# Patient Record
Sex: Male | Born: 1975 | Race: White | Hispanic: No | Marital: Married
Health system: Southern US, Community
[De-identification: ages and names within clinical notes are randomized; demographics above are authoritative.]

---

## 2007-09-12 ENCOUNTER — Emergency Department (HOSPITAL_COMMUNITY): Admission: EM | Admit: 2007-09-12 | Discharge: 2007-09-12 | Payer: Self-pay | Admitting: Emergency Medicine

## 2008-03-02 ENCOUNTER — Inpatient Hospital Stay (HOSPITAL_COMMUNITY): Admission: EM | Admit: 2008-03-02 | Discharge: 2008-03-06 | Payer: Self-pay | Admitting: Emergency Medicine

## 2008-04-26 ENCOUNTER — Ambulatory Visit (HOSPITAL_COMMUNITY): Admission: RE | Admit: 2008-04-26 | Discharge: 2008-04-26 | Payer: Self-pay | Admitting: General Surgery

## 2010-01-10 IMAGING — CT CT ABDOMEN W/ CM
2 of 5 series · 13 of 42 positions shown, 19 images · IV contrast (agent unspecified)
Comparison: None

CT ABDOMEN

CLINICAL DATA: Snowboarding accident.  Left flank pain, left upper
quadrant pain.

CT ABDOMEN AND PELVIS WITH CONTRAST
TECHNIQUE: Multidetector CT imaging of the abdomen and pelvis was
performed using the standard protocol following bolus
administration of intravenous contrast.
Contrast: 100 ml Gmnipaque-OMM

[Series 2: routine abdomen · axial · 0.87mm/px · z∈[-454,-49]mm · 10 of 101 slices shown, 16 images]
[im 10/101  soft-tissue]
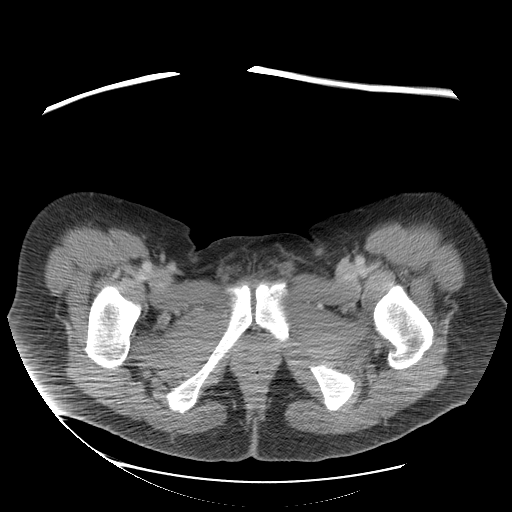
[im 10/101  bone]
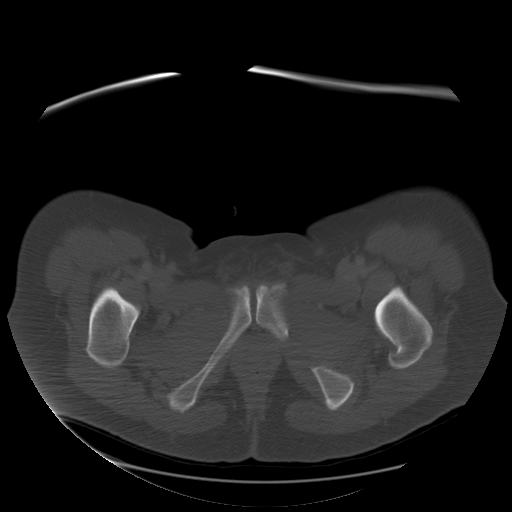
[im 19/101  soft-tissue]
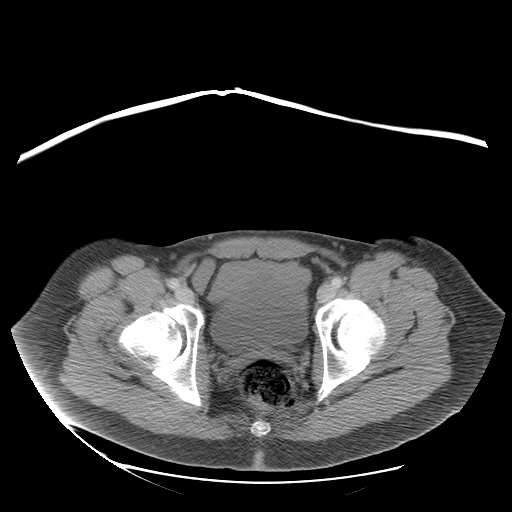
[im 28/101  soft-tissue]
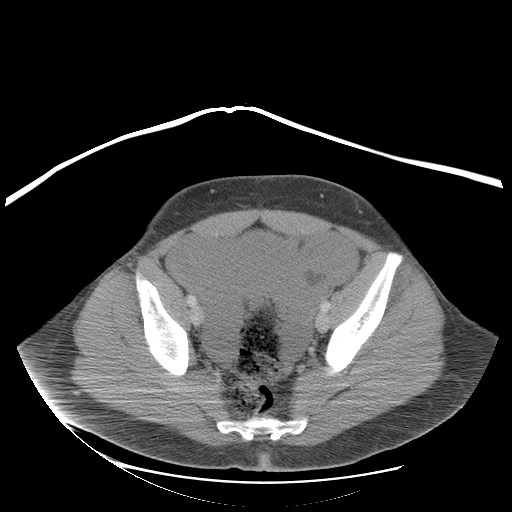
[im 37/101  soft-tissue]
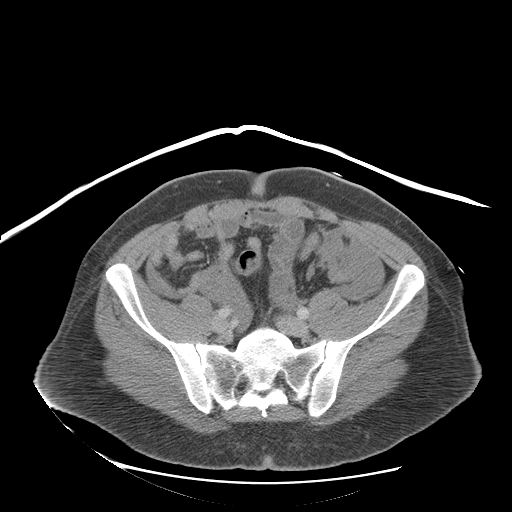
[im 46/101  soft-tissue]
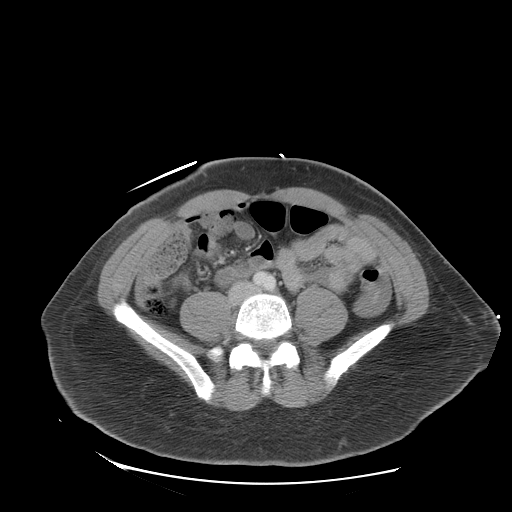
[im 55/101  soft-tissue]
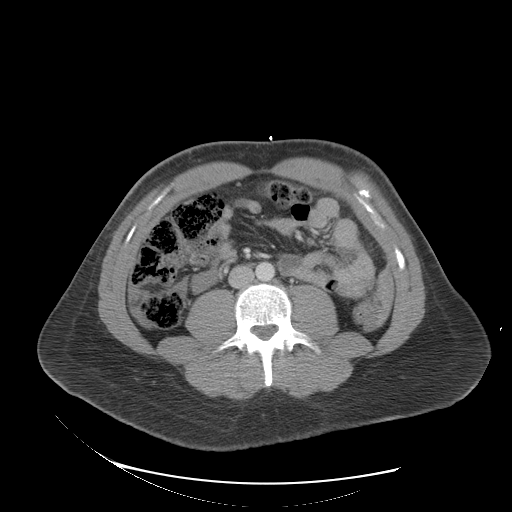
[im 64/101  soft-tissue]
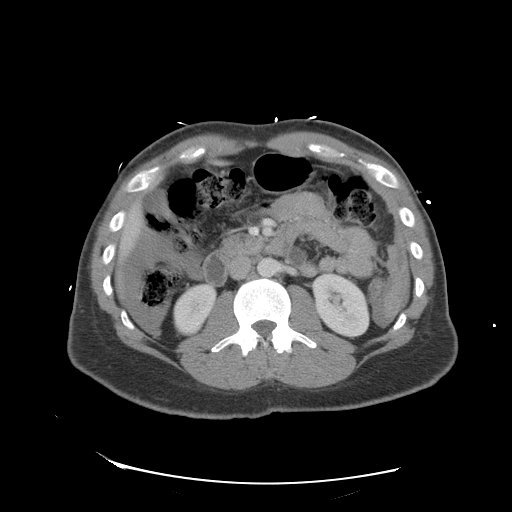
[im 64/101  lung]
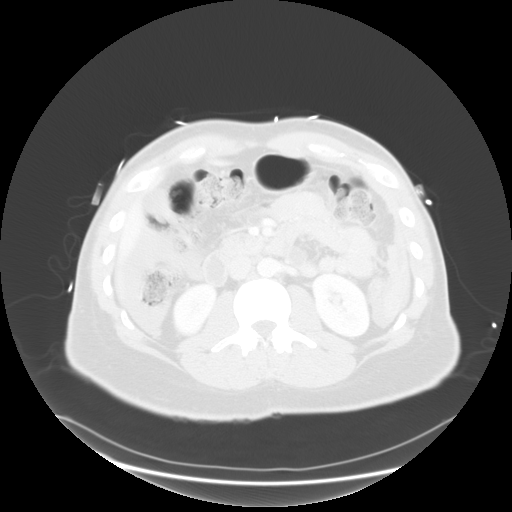
[im 73/101  soft-tissue]
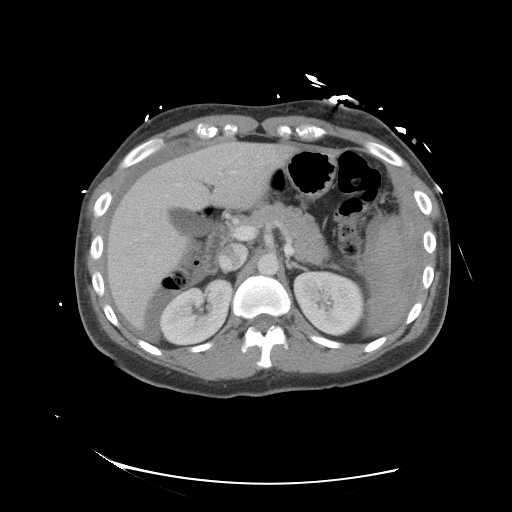
[im 73/101  lung]
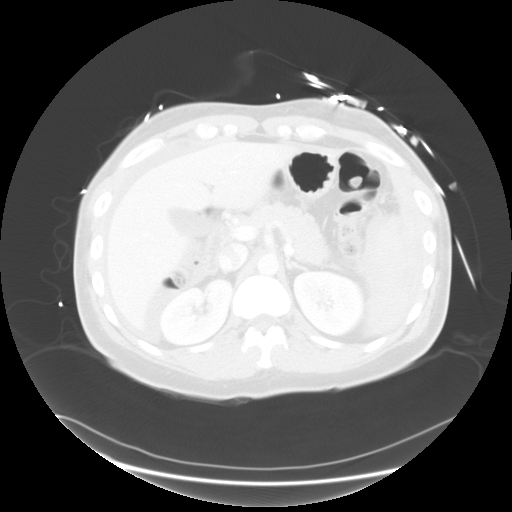
[im 82/101  soft-tissue]
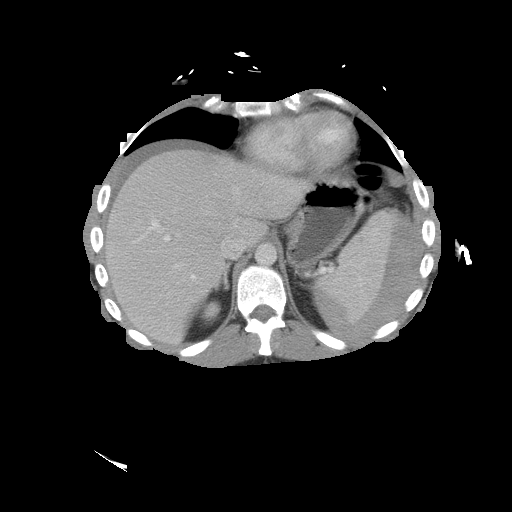
[im 82/101  lung]
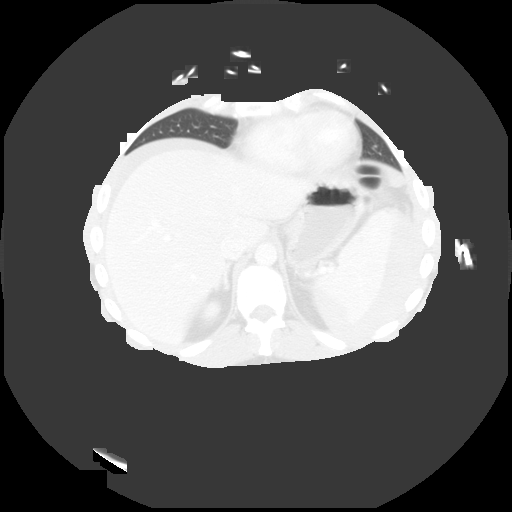
[im 82/101  bone]
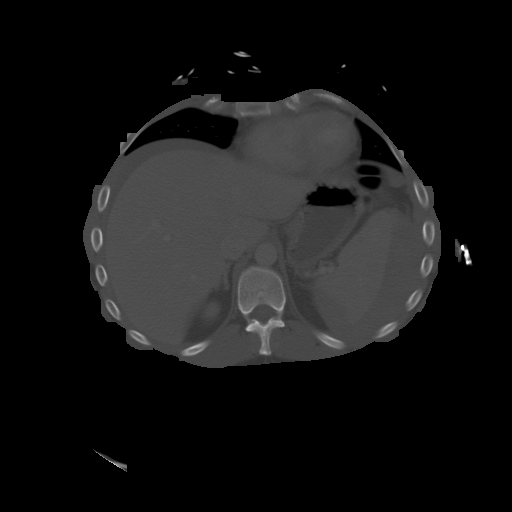
[im 91/101  soft-tissue]
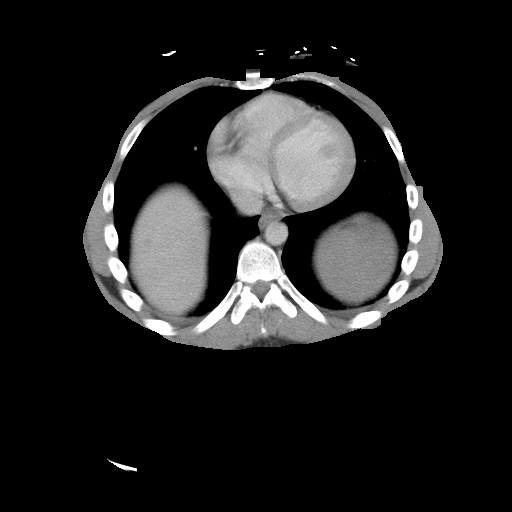
[im 91/101  lung]
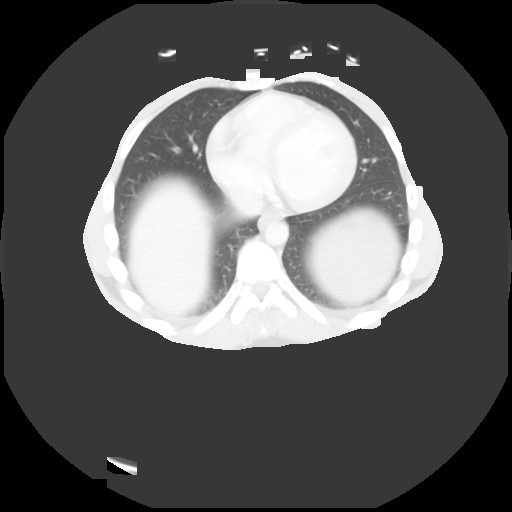

[Series 400: cor · coronal · 1.00mm/px · 3 of 85 slices shown]
[im 29/85  soft-tissue]
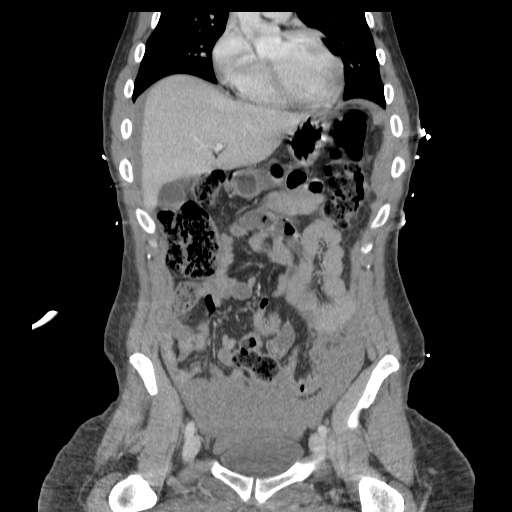
[im 38/85  soft-tissue]
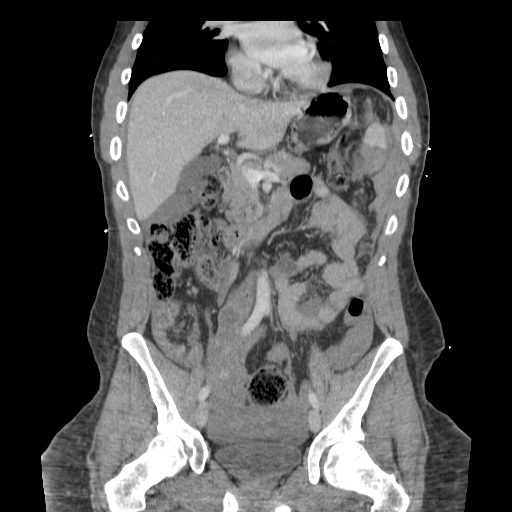
[im 47/85  soft-tissue]
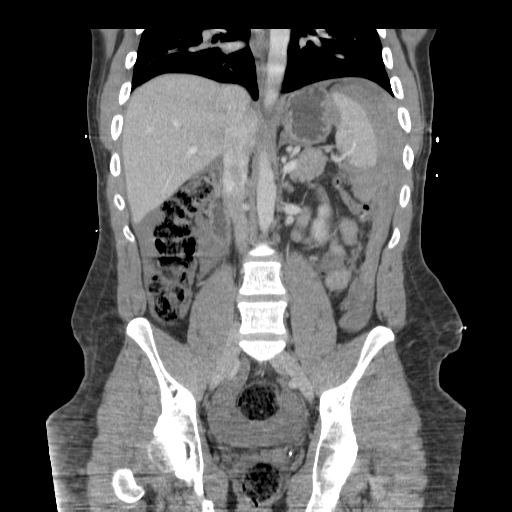

[13 of 42 positions shown; findings below may reference images not displayed]

FINDINGS: Lacerations are noted through the inferior aspect of the
spleen.  There is a large amount of perisplenic blood.  No active
extravasation seen.  Blood also noted around the liver.  Liver,
pancreas, adrenals, kidneys unremarkable.  Bowel grossly
unremarkable.  No free air.  Aorta is normal caliber.

Lung bases are clear.  No effusions.  Heart is normal size. No
acute bony abnormality.
IMPRESSION: At least two lacerations noted in the inferior aspect of the
spleen, with moderate hemoperitoneum

CT PELVIS
FINDINGS: Moderate to large layering blood in the pelvis.  Bowel
grossly unremarkable.  No free air.  Bony pelvis intact.
IMPRESSION: Moderate to large hemoperitoneum.

Critical test results telephoned to Dr. Kamal Eddine at the time of
interpretation on 03/02/2008 at 8111.

## 2010-06-15 LAB — SAMPLE TO BLOOD BANK

## 2010-06-15 LAB — DIFFERENTIAL
Basophils Absolute: 0 10*3/uL (ref 0.0–0.1)
Basophils Absolute: 0 K/uL (ref 0.0–0.1)
Basophils Relative: 0 % (ref 0–1)
Basophils Relative: 0 % (ref 0–1)
Eosinophils Absolute: 0 10*3/uL (ref 0.0–0.7)
Eosinophils Relative: 0 % (ref 0–5)
Lymphocytes Relative: 29 % (ref 12–46)
Lymphocytes Relative: 9 % — ABNORMAL LOW (ref 12–46)
Lymphs Abs: 0.9 K/uL (ref 0.7–4.0)
Monocytes Absolute: 0.8 10*3/uL (ref 0.1–1.0)
Monocytes Absolute: 1.1 10*3/uL — ABNORMAL HIGH (ref 0.1–1.0)
Monocytes Relative: 10 % (ref 3–12)
Neutro Abs: 2.7 10*3/uL (ref 1.7–7.7)
Neutro Abs: 8.8 K/uL — ABNORMAL HIGH (ref 1.7–7.7)
Neutrophils Relative %: 54 % (ref 43–77)
Neutrophils Relative %: 81 % — ABNORMAL HIGH (ref 43–77)

## 2010-06-15 LAB — CBC
HCT: 27.7 % — ABNORMAL LOW (ref 39.0–52.0)
HCT: 31.8 % — ABNORMAL LOW (ref 39.0–52.0)
HCT: 38.5 % — ABNORMAL LOW (ref 39.0–52.0)
Hemoglobin: 10.6 g/dL — ABNORMAL LOW (ref 13.0–17.0)
Hemoglobin: 12.8 g/dL — ABNORMAL LOW (ref 13.0–17.0)
Hemoglobin: 9.1 g/dL — ABNORMAL LOW (ref 13.0–17.0)
Hemoglobin: 9.3 g/dL — ABNORMAL LOW (ref 13.0–17.0)
MCHC: 33.2 g/dL (ref 30.0–36.0)
MCHC: 33.7 g/dL (ref 30.0–36.0)
MCHC: 33.8 g/dL (ref 30.0–36.0)
MCHC: 34.5 g/dL (ref 30.0–36.0)
MCV: 93.2 fL (ref 78.0–100.0)
MCV: 93.3 fL (ref 78.0–100.0)
MCV: 93.7 fL (ref 78.0–100.0)
MCV: 93.9 fL (ref 78.0–100.0)
Platelets: 160 10*3/uL (ref 150–400)
Platelets: 173 10*3/uL (ref 150–400)
Platelets: 257 K/uL (ref 150–400)
RBC: 2.81 MIL/uL — ABNORMAL LOW (ref 4.22–5.81)
RBC: 2.95 MIL/uL — ABNORMAL LOW (ref 4.22–5.81)
RBC: 2.95 MIL/uL — ABNORMAL LOW (ref 4.22–5.81)
RBC: 3.35 MIL/uL — ABNORMAL LOW (ref 4.22–5.81)
RBC: 3.41 MIL/uL — ABNORMAL LOW (ref 4.22–5.81)
RBC: 4.13 MIL/uL — ABNORMAL LOW (ref 4.22–5.81)
RDW: 12.7 % (ref 11.5–15.5)
RDW: 12.8 % (ref 11.5–15.5)
RDW: 13.1 % (ref 11.5–15.5)
WBC: 10.9 K/uL — ABNORMAL HIGH (ref 4.0–10.5)
WBC: 4.6 10*3/uL (ref 4.0–10.5)
WBC: 5 10*3/uL (ref 4.0–10.5)
WBC: 5.3 10*3/uL (ref 4.0–10.5)
WBC: 6.3 10*3/uL (ref 4.0–10.5)

## 2010-06-15 LAB — BASIC METABOLIC PANEL
CO2: 25 mEq/L (ref 19–32)
Chloride: 106 mEq/L (ref 96–112)
GFR calc Af Amer: >60 mL/min (ref >60)
Potassium: 3.8 mEq/L (ref 3.5–5.1)
Sodium: 138 mEq/L (ref 135–145)

## 2010-06-15 LAB — COMPREHENSIVE METABOLIC PANEL
ALT: 19 U/L (ref 0–53)
CO2: 26 mEq/L (ref 19–32)
Calcium: 8 mg/dL — ABNORMAL LOW (ref 8.4–10.5)
Chloride: 107 mEq/L (ref 96–112)
Creatinine, Ser: 0.98 mg/dL (ref 0.4–1.5)
GFR calc non Af Amer: >60 mL/min (ref >60)
Glucose, Bld: 159 mg/dL — ABNORMAL HIGH (ref 70–99)
Sodium: 137 mEq/L (ref 135–145)
Total Bilirubin: 1.4 mg/dL — ABNORMAL HIGH (ref 0.3–1.2)

## 2010-06-15 LAB — BASIC METABOLIC PANEL WITH GFR
BUN: 20 mg/dL (ref 6–23)
Calcium: 9 mg/dL (ref 8.4–10.5)
Creatinine, Ser: 1.04 mg/dL (ref 0.4–1.5)
GFR calc non Af Amer: >60 mL/min (ref >60)
Glucose, Bld: 133 mg/dL — ABNORMAL HIGH (ref 70–99)

## 2010-06-15 LAB — LIPASE, BLOOD: Lipase: 14 U/L (ref 11–59)

## 2010-06-15 LAB — HEMOGLOBIN AND HEMATOCRIT, BLOOD
HCT: 27.6 % — ABNORMAL LOW (ref 39.0–52.0)
Hemoglobin: 9.5 g/dL — ABNORMAL LOW (ref 13.0–17.0)

## 2010-07-14 NOTE — Discharge Summary (Signed)
NAME:  Frederick Welch, Frederick Welch NO.:  [id number]   MEDICAL RECORD NO.:  [id number]          PATIENT TYPE:  INP   LOCATION:  3303                         FACILITY:  [facility]   PHYSICIAN:  Cherylynn Ridges, M.D.    DATE OF BIRTH:  Feb 01, 1976   DATE OF ADMISSION:  03/02/2008  DATE OF DISCHARGE:  03/06/2008                               DISCHARGE SUMMARY   ADMITTING TRAUMA SURGEON:  Avel Peace, MD   DISCHARGE DIAGNOSES:  1. Status post snowboarding accident.  2. Grade 3 splenic laceration.  3. Hemoperitoneum.  4. Moderate-to-acute blood loss anemia.   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  He had a fair amount of pain,  but was eventually able to make it [place] the [place] and then went home  back here in [place].  When he returned home, he had increasing pain,  especially with deep breaths and at one point, he got up and had a near  syncopal episode.  He subsequently was brought to the emergency room for  evaluation and a CT scan of the abdomen revealed lacerations of the  inferior spleen with a moderate-sized hemoperitoneum.  Dr. Abbey Chatters  was consulted and the patient was evaluated.  Again, he had complaints  of abdominal pain.  A CT scan was reviewed and again showed inferior  splenic laceration grade 3 with perisplenic, perihepatic, and pelvic  free fluid.  Hemoglobin was 12.8.  He was hemodynamically stable with a  blood pressure of 106/64, pulse of 76, respiratory rate of 18, O2 sat of  100% on room air when Dr. Abbey Chatters evaluated the patient.  He was  admitted to the ICU for observation and did well and remained  hemodynamically stable overnight.  His hemoglobin remained stable in the  10.6 range.  He was kept on strict bedrest through the hospital day 3  and allowed to be out of bed to chair on hospital day #4,  although his  hemoglobin had continued to drift downward to 8.9.  His IV fluids were  discontinued as he was taking p.o.'s well and his hemoglobin did improve  the following morning to 9.5, platelets improved to 176.  He was  ambulated and again was continuing to tolerate a regular diet well and  hemoglobin later that day was 10.8.  As he was ambulatory and continued  to feel well with little complaints of pain, it was felt he could be  discharged home with his wife who is also a PA here locally.  He was  cautioned against any vigorous activity for the next several weeks.  He  will follow up in [facility]uma Clinic on March 14, 2008 at 2:00 p.m. or  sooner should he have any difficulties in the interim.  He is cautioned  against the use of aspirin or ibuprofen products after discharge.   Diet is regular.   Medications include Percocet 5/325 one to two p.o. q.4 h. p.r.n. pain  #80 no refill, Tylenol as needed  for milder pain, multivitamin with iron  one daily x1 month and Colace as needed.   Again, he will follow up with Trauma Service on March 14, 2008.  We do  anticipate that he will need to be set up for a followup CT scan  approximately 6 weeks post injury to determine return to normal activity  level.  We will also need to determine timeframe to return to work when  he is seen at his office visit next week.      Shawn Rayburn, P.A.      Cherylynn Ridges, M.D.  Electronically Signed    SR/MEDQ  D:  03/06/2008  T:  03/07/2008  Job:  [id number]

## 2010-07-14 NOTE — H&P (Signed)
NAME:  Frederick Welch, Frederick Welch NO.:  [id number]   MEDICAL RECORD NO.:  [id number]          PATIENT TYPE:  INP   LOCATION:  2102                         FACILITY:  [facility]   PHYSICIAN:  Adolph Pollack, M.D.DATE OF BIRTH:  12/23/1975   DATE OF ADMISSION:  03/02/2008  DATE OF DISCHARGE:                              HISTORY & PHYSICAL   HISTORY:  The patient is an otherwise healthy 35 year old male, snow-  boarding today up in [place] and he fell onto his left side at noon.  He had a fair amount of pain, was able to make it down the mountain and  then went home.  While he was at home, he had some increasing pain,  especially with the deep breath and at one point, he got up and had near  syncopal episode when lying down in bed.  He subsequently drove himself  here to the hospital for evaluation.  Part of his evaluation include a  CT scan which demonstrated lacerations of the inferior spleen with a  moderate-sized hemoperitoneum.  At this point, I was asked to see him.  He has had IV fluids and has not been tachycardic, but had pressures in  the low 90s at times.   He says it is difficult for him to lie down because of pain in the tops  of both shoulders.  He is a little bit more comfortable now after pain  medication.  One dip in his blood pressure correlated with  administration of pain medicine.  No nausea.  Denies any other trauma.   PAST MEDICAL HISTORY:  No chronic illnesses.   PREVIOUS OPERATIONS:  Removal of cyst for knee.   ALLERGIES:  None.   MEDICATIONS:  He took Motrin and Tylenol 3 after the injury today   SOCIAL HISTORY:  He lives with his wife.  He is employed as a  Surveyor, mining in [place].  Denies tobacco use.  Occasional  alcohol use.   REVIEW OF SYSTEMS:  CARDIOVASCULAR:  No heart disease or hypertension.  PULMONARY:  No asthma, pneumonia, or TB.  GI:  No peptic ulcers,  hepatitis, or diverticulitis.  GU:  No kidney stones or hematuria.  ENDOCRINE:  No diabetes.  HEMATOLOGIC:  No bleeding disorders, blood  clots, or transfusions.   PHYSICAL EXAMINATION:  GENERAL:  A well-developed, well-nourished male  in no acute distress, sitting upright in bed, pleasant and cooperative.  VITAL SIGNS:  Temperature is 97.9, blood pressure is 106/64, pulse is  76, respiratory rate 18, and O2 saturation 100% on room air.  HEENT:  He is normocephalic and atraumatic.  PERRL.  EOMI.  No ear  trauma.  Ears are atraumatic.  NECK:  Supple with trachea midline and no C-spine tenderness.  PULMONARY/CHEST:  There is some inferolateral left chest tenderness.  No  crepitus.  Breath sounds are equal and clear.  CARDIOVASCULAR:  Regular rate and regular rhythm.  Good distal pulses.  ABDOMEN:  Soft.  No significant tenderness present.  PELVIS:  No tenderness or deformity.  MUSCULOSKELETAL:  Good range of motion.  No bony tenderness, deformity,  or ecchymosis.  BACK:  No spinal tenderness or deformity.  NEUROLOGIC:  Alert and oriented x3.  Glasgow coma scale is 15.  Good  motor strength.   LABORATORY DATA:  Notable for hemoglobin 12.8, platelet count 257,000,  and white cell count 10,900.  Electrolytes within normal limits except  glucose 133.   X-RAYS:  Chest x-ray demonstrates no pneumothorax or obvious rib  fracture.  CT of the abdomen and pelvis demonstrates inferior splenic  lacerations, approximately grade 3 with some perisplenic, perihepatic,  and pelvic free fluid.   IMPRESSION:  Fall onto left side with a grade 3 splenic laceration and  moderate hemoperitoneum.  Currently, he is hemodynamically normal and  stable.   PLAN:  We will admit him to Intensive Care Unit for attempted  nonoperative management.  Bedrest.  Serial hemoglobins.  I did tell them  that he became hypotensive and tachycardiac, that this may require  emergency splenectomy and he seems to understand this.      Adolph Pollack, M.D.  Electronically Signed      TJR/MEDQ  D:  03/02/2008  T:  03/03/2008  Job:  [id number]

## 2017-01-24 DIAGNOSIS — Z0000 Encounter for general adult medical examination without abnormal findings: Secondary | ICD-10-CM | POA: Diagnosis not present

## 2017-01-24 DIAGNOSIS — R82998 Other abnormal findings in urine: Secondary | ICD-10-CM | POA: Diagnosis not present

## 2017-02-03 DIAGNOSIS — Z1389 Encounter for screening for other disorder: Secondary | ICD-10-CM | POA: Diagnosis not present

## 2017-02-03 DIAGNOSIS — Z0000 Encounter for general adult medical examination without abnormal findings: Secondary | ICD-10-CM | POA: Diagnosis not present

## 2017-02-03 DIAGNOSIS — Z23 Encounter for immunization: Secondary | ICD-10-CM | POA: Diagnosis not present

## 2017-02-10 DIAGNOSIS — Z1212 Encounter for screening for malignant neoplasm of rectum: Secondary | ICD-10-CM | POA: Diagnosis not present

## 2017-03-11 ENCOUNTER — Ambulatory Visit (INDEPENDENT_AMBULATORY_CARE_PROVIDER_SITE_OTHER): Payer: 59 | Admitting: Internal Medicine

## 2017-03-11 ENCOUNTER — Encounter: Payer: Self-pay | Admitting: Internal Medicine

## 2017-03-11 DIAGNOSIS — Z9189 Other specified personal risk factors, not elsewhere classified: Secondary | ICD-10-CM

## 2017-03-11 DIAGNOSIS — Z7184 Encounter for health counseling related to travel: Secondary | ICD-10-CM | POA: Insufficient documentation

## 2017-03-11 DIAGNOSIS — Z7189 Other specified counseling: Secondary | ICD-10-CM

## 2017-03-11 DIAGNOSIS — Z7185 Encounter for immunization safety counseling: Secondary | ICD-10-CM

## 2017-03-11 DIAGNOSIS — Z789 Other specified health status: Secondary | ICD-10-CM

## 2017-03-11 MED ORDER — TYPHOID VACCINE PO CPDR
1.0000 | DELAYED_RELEASE_CAPSULE | ORAL | 0 refills | Status: AC
Start: 2017-03-11 — End: ?

## 2017-03-11 MED ORDER — ATOVAQUONE-PROGUANIL HCL 250-100 MG PO TABS: 1.0000 | ORAL_TABLET | Freq: Every day | ORAL | 0 refills | Status: AC

## 2017-03-11 MED ORDER — AZITHROMYCIN 500 MG PO TABS: 1000.0000 mg | ORAL_TABLET | Freq: Once | ORAL | 0 refills | Status: AC

## 2017-03-11 MED ORDER — PROMETHAZINE HCL 25 MG PO TABS: 25.0000 mg | ORAL_TABLET | Freq: Four times a day (QID) | ORAL | 0 refills | Status: AC | PRN

## 2017-03-11 NOTE — Progress Notes (Signed)
Subjective:   Frederick Welch is a 42 y.o. male who presents to the Infectious Disease clinic for travel consultation. Planned departure date: April 2019          Planned return date: 4 weeks Countries of travel: [place] Areas in country: urban   Accommodations: hotel Purpose of travel: vacation Prior travel out of [place]: yes     Objective:   Medications: none    Assessment:   No contraindications to travel. none     Plan:    Issues discussed: environmental concerns, freshwater swimming, future shots, insect-borne illnesses, Japanese encephalitis, jet lag, malaria, MVA safety, rabies, safe food/water, traveler's diarrhea, website/handouts for more information, what to do if ill upon return, what to do if ill while there and Yellow Fever. Immunizations recommended: Hepatitis A series. Malaria prophylaxis: malarone, daily dose starting 1-2 days before entering endemic area, ending 7 days after leaving area Traveler's diarrhea prophylaxis: azithromycin. Total duration of visit: 1 Hour. Total time spent on education, counseling, coordination of care: 30 Minutes.

## 2017-03-11 NOTE — Patient Instructions (Signed)
[facility]                [address], [address]                   [place], [place] [address]                      Phone: [phone]                        Fax: [phone]   Planned departure date: April 2019          Planned return date: 4 weeks Countries of travel: [place]  and [place]   Guidelines for the Prevention & Treatment of Traveler's Diarrhea  Prevention: "Boil it, Peel it, Adriana Simas it, or Forget it"   the fewer chances -> lower risk: try to stick to food & water precautions as much as possible"   If it's "piping hot"; it is probably okay, if not, it may not be   Treatment   1) You should always take care to drink lots of fluids in order to avoid dehydration   2) You should bring medications with you in case you come down with a case of diarrhea   3) OTC = bring pepto-bismol - can take with initial abdominal symptoms;                    Imodium - can help slow down your intestinal tract, can help relief cramps                    and diarrhea, can take if no bloody diarrhea  Use azithromycin if needed for traveler's diarrhea  Guidelines for the Prevention of Malaria  Avoidance:  -fewer mosquito bites = lower risk. Mosquitos can bite at night as well as daytime  -cover up (long sleeve clothing), mosquito nets, screens  -Insect repellent for your skin ( DEET containing lotion > 20%): for clothes ( permethrin spray)   2 days prior to travel to [place], start [place], daily dose starting 1-2 days before entering endemic area, ending 7 days after leaving area for malaria prevention.   Immunizations received today: Hepatitis A series and Typhoid (oral)  Future immunizations, if indicated Hepatitis A series after 6 months   Prior to travel:  1) Be sure to pick up appropriate prescriptions, including medicine you take daily. Do not expect to be able to fill your prescriptions abroad.  2) Strongly consider obtaining  traveler's insurance, including emergency evacuation insurance. Most plans in the [place] do not cover participants abroad. (see below for resources)  3) Register at the appropriate [place] embassy or consulate with travel dates so they are aware of your presence in-country and for helpful advice during travel using the [employer] (STEP, GuyGalaxy.si).  4) Leave contact information with a relative or friend.  5) Keep a Corporate treasurer, credit cards in case they become lost or stolen  6) Inform your credit card company that you will be travelling abroad   During travel:  1) If you become ill and need medical advice, the [place] [employer] of the country you are traveling in general provides a list of English speaking doctors.  We are also available on MyChart for remote consultation if you register prior to travel. 2) Avoid motorcycles or scooters when at all possible. Traffic laws  in many countries are lax and accidents occur frequently.  3) Do not take any unnecessary risks that you wouldn't do at home.   Resources:  -Country specific information: http://www.church.org/www.cdc.gov/travel or GuyGalaxy.sihttps://step.state.gov/step  -Chief Strategy OfficerTravel Supplies (DEET, mosquito nets): [employer], [employer] store, [employer], [employer]  -Travel insurance options: gatewayplans.com; FixItGel.esmedexassist.com; travelguard.com or [employer], gninsurance.com or info@gninsurance .com, 418-653-3358(316)129-3972.   Post Travel:  If you return from your trip ill, call your primary care doctor or our travel clinic @ [phone].   Enjoy your trip and know that with proper pre-travel preparation, most people have an enjoyable and uninterrupted trip!

## 2017-03-17 DIAGNOSIS — Z23 Encounter for immunization: Secondary | ICD-10-CM | POA: Diagnosis not present

## 2018-02-27 DIAGNOSIS — Z125 Encounter for screening for malignant neoplasm of prostate: Secondary | ICD-10-CM | POA: Diagnosis not present

## 2018-02-27 DIAGNOSIS — Z0000 Encounter for general adult medical examination without abnormal findings: Secondary | ICD-10-CM | POA: Diagnosis not present

## 2018-02-27 DIAGNOSIS — R82998 Other abnormal findings in urine: Secondary | ICD-10-CM | POA: Diagnosis not present
